# Patient Record
Sex: Male | Born: 1995 | Race: White | Hispanic: No | Marital: Single | Smoking: Current some day smoker
Health system: Southern US, Community
[De-identification: ages and names within clinical notes are randomized; demographics above are authoritative.]

## PROBLEM LIST (undated history)

## (undated) HISTORY — PX: FRACTURE SURGERY: SHX138

---
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
## 2019-01-31 ENCOUNTER — Emergency Department (HOSPITAL_COMMUNITY): Payer: Self-pay

## 2019-01-31 ENCOUNTER — Encounter (HOSPITAL_COMMUNITY): Payer: Self-pay | Admitting: Emergency Medicine

## 2019-01-31 ENCOUNTER — Emergency Department (HOSPITAL_COMMUNITY)
Admission: EM | Admit: 2019-01-31 | Discharge: 2019-01-31 | Disposition: A | Discharge status: Home / Self Care | Payer: Self-pay | Attending: Emergency Medicine | Admitting: Emergency Medicine

## 2019-01-31 ENCOUNTER — Other Ambulatory Visit: Payer: Self-pay

## 2019-01-31 DIAGNOSIS — S62367A Nondisplaced fracture of neck of fifth metacarpal bone, left hand, initial encounter for closed fracture: Secondary | ICD-10-CM | POA: Insufficient documentation

## 2019-01-31 DIAGNOSIS — Y9259 Other trade areas as the place of occurrence of the external cause: Secondary | ICD-10-CM | POA: Insufficient documentation

## 2019-01-31 DIAGNOSIS — W2209XA Striking against other stationary object, initial encounter: Secondary | ICD-10-CM | POA: Insufficient documentation

## 2019-01-31 DIAGNOSIS — Y9389 Activity, other specified: Secondary | ICD-10-CM | POA: Insufficient documentation

## 2019-01-31 DIAGNOSIS — F17210 Nicotine dependence, cigarettes, uncomplicated: Secondary | ICD-10-CM | POA: Insufficient documentation

## 2019-01-31 DIAGNOSIS — Y990 Civilian activity done for income or pay: Secondary | ICD-10-CM | POA: Insufficient documentation

## 2019-01-31 MED ORDER — MELOXICAM 7.5 MG PO TABS: 7.5000 mg | ORAL_TABLET | Freq: Two times a day (BID) | ORAL | 0 refills | Status: AC | PRN

## 2019-01-31 NOTE — Discharge Instructions (Signed)
Your testing shows either a new or old small fracture at the end of your fifth hand bone called the metacarpal.  Please wear the splint for the next week until you follow-up with the orthopedist.  I would recommend that you do not take this off but rather shower or bathe with a bag over your hand so that it does not get wet.  You may take ibuprofen or Aleve as needed for pain.  Ice and elevate to help minimize swelling.  Seek medical exam for severe or worsening symptoms including increasing pain numbness or weakness

## 2019-01-31 NOTE — ED Notes (Signed)
Rad in    Awaiting results

## 2019-01-31 NOTE — ED Triage Notes (Signed)
Punched a wall three days ago  Now with pain swelling and painful movement with fingers

## 2019-01-31 NOTE — ED Provider Notes (Signed)
[facility] EMERGENCY DEPARTMENT Provider Note   CSN: [id number] Arrival date & time: 01/31/19  1330    History   Chief Complaint Chief Complaint  Patient presents with  . Hand Problem    HPI Darrell Spencer is a 23 y.o. male.     HPI  This otherwise healthy 23 year old male states [place] [address] [place] [place] to punch a wall when he became upset while he was intoxicated.  He states that he had acute onset of pain but did not get it checked till he got home.  [place] is now [place] in [place] and wants this evaluated.  The pain is persistent, worse with movement of the hand and associated with swelling.  He has been taking ibuprofen with minimal relief.  No prior injury to this hand  History reviewed. No pertinent past medical history.  There are no active problems to display for this patient.   Past Surgical History:  Procedure Laterality Date  . FRACTURE SURGERY          Home Medications    Prior to Admission medications   Medication Sig Start Date End Date Taking? Authorizing Provider  meloxicam (MOBIC) 7.5 MG tablet Take 1 tablet (7.5 mg total) by mouth 2 (two) times daily as needed for up to 14 days for pain. 01/31/19 02/14/19  Eber HongMiller, Sire Poet, MD    Family History No family history on file.  Social History Social History   Tobacco Use  . Smoking status: Current Some Day Smoker    Packs/day: 1.00  . Smokeless tobacco: Never Used  Substance Use Topics  . Alcohol use: Yes  . Drug use: Yes    Types: Marijuana     Allergies   Patient has no known allergies.   Review of Systems Review of Systems  Musculoskeletal: Positive for arthralgias and myalgias.  Neurological: Negative for weakness and numbness.  Hematological: Does not bruise/bleed easily.     Physical Exam Updated Vital Signs BP (!) 143/64 (BP Location: Right Arm)   Pulse 66   Temp 98.6 F (37 C) (Oral)   Resp 16   Ht 1.702 m (5\' 7" )   Wt 63.5 kg   SpO2  99%   BMI 21.93 kg/m   Physical Exam Vitals signs and nursing note reviewed.  Constitutional:      Appearance: He is well-developed. He is not diaphoretic.  HENT:     Head: Normocephalic and atraumatic.  Eyes:     General:        Right eye: No discharge.        Left eye: No discharge.     Conjunctiva/sclera: Conjunctivae normal.  Pulmonary:     Effort: Pulmonary effort is normal. No respiratory distress.  Musculoskeletal:     Comments: Dec ROM of the fingers of the L hand - has pain with this - bruising of the fingers 5,4,3 (mild).  Normal ROM of the L wrist - other fingers / hands / legs without deformity  Skin:    General: Skin is warm and dry.     Findings: No erythema or rash.  Neurological:     Mental Status: He is alert.     Coordination: Coordination normal.     Comments: Normal sensation of the L hand      ED Treatments / Results  Labs (all labs ordered are listed, but only abnormal results are displayed) Labs Reviewed - No data to display  EKG  None  Radiology Dg Wrist Complete Left  Result Date: 01/31/2019 CLINICAL DATA:  Struck wall several days ago with persistent wrist pain, initial encounter EXAM: LEFT WRIST - COMPLETE 3+ VIEW COMPARISON:  None. FINDINGS: Changes consistent with a fifth metacarpal fracture are noted distally. Carpal bones appear within normal limits. No soft tissue abnormality is noted. IMPRESSION: Fifth metacarpal fracture. Normal appearing carpal bones. Electronically Signed   By: Inez Catalina M.D.   On: 01/31/2019 14:14   Dg Hand Complete Left  Result Date: 01/31/2019 CLINICAL DATA:  Left hand pain after injury several days ago. EXAM: LEFT HAND - COMPLETE 3+ VIEW COMPARISON:  Radiographs of January 29, 2013. FINDINGS: Deformity is seen involving distal fifth metacarpal consistent with probable old healed fracture. No acute fracture or dislocation is noted. Joint spaces are intact. No soft tissue abnormality is noted. IMPRESSION: Probable  old fracture seen involving distal fifth metacarpal. No definite acute abnormality seen. Electronically Signed   By: Marijo Conception M.D.   On: 01/31/2019 14:13    Procedures .Splint Application  Date/Time: 01/31/2019 2:46 PM Performed by: Noemi Chapel, MD Authorized by: Noemi Chapel, MD   Consent:    Consent obtained:  Verbal   Consent given by:  Patient   Risks discussed:  Discoloration, numbness, pain and swelling   Alternatives discussed:  No treatment Pre-procedure details:    Sensation:  Normal   Skin color:  Normal Procedure details:    Laterality:  Left   Location:  Hand   Hand:  L hand   Splint type:  Ulnar gutter   Supplies:  Ortho-Glass and cotton padding Post-procedure details:    Pain:  Improved   Sensation:  Normal   Skin color:  Normal   Patient tolerance of procedure:  Tolerated well, no immediate complications Comments:         (including critical care time)  Medications Ordered in ED Medications - No data to display   Initial Impression / Assessment and Plan / ED Course  I have reviewed the triage vital signs and the nursing notes.  Pertinent labs & imaging results that were available during my care of the patient were reviewed by me and considered in my medical decision making (see chart for details).  Clinical Course as of Jan 30 1446  Mon Jan 31, 2019  1442 I have personally looked at the x-ray, there is some evidence of either a previous or acute distal fifth metacarpal fracture.  Findings favor hold fracture.  Splinting placed by myself, please see attached notes   [BM]    Clinical Course User Index [BM] Noemi Chapel, MD       Injury to the hand, rule out fractures, has tenderness in the mid hand over the second through fifth metacarpals with mild deformity and swelling of the hand.  The patient is left-hand dominant, he has been taking ibuprofen with minimal relief imaging pending.  Final Clinical Impressions(s) / ED Diagnoses    Final diagnoses:  Closed nondisplaced fracture of neck of fifth metacarpal bone of left hand, initial encounter    ED Discharge Orders         Ordered    meloxicam (MOBIC) 7.5 MG tablet  2 times daily PRN     01/31/19 1447           Noemi Chapel, MD 01/31/19 1447

## 2019-02-01 ENCOUNTER — Telehealth: Payer: Self-pay | Admitting: Orthopedic Surgery

## 2019-02-01 NOTE — Telephone Encounter (Signed)
Patient returned call, and has been scheduled. Aware of appointment (Dr Luna Glasgow, Thursday, 02/03/19).

## 2019-02-01 NOTE — Telephone Encounter (Signed)
Call received via voice message from patient while closed for lunch inquiring about scheduling, following Emergency room visit for hand injury. Returned call to patient, [phone]; left message to return call

## 2019-02-03 ENCOUNTER — Other Ambulatory Visit: Payer: Self-pay

## 2019-02-03 ENCOUNTER — Ambulatory Visit: Payer: Self-pay | Admitting: Orthopaedic Surgery

## 2019-02-03 ENCOUNTER — Encounter: Payer: Self-pay | Admitting: Orthopaedic Surgery

## 2019-02-03 VITALS — BP 115/74 | HR 60 | Temp 98.3°F | Ht 67.0 in | Wt 151.0 lb

## 2019-02-03 DIAGNOSIS — F17210 Nicotine dependence, cigarettes, uncomplicated: Secondary | ICD-10-CM

## 2019-02-03 DIAGNOSIS — S62347A Nondisplaced fracture of base of fifth metacarpal bone. left hand, initial encounter for closed fracture: Secondary | ICD-10-CM

## 2019-02-03 NOTE — Patient Instructions (Signed)

## 2019-02-03 NOTE — Progress Notes (Signed)
Subjective:    Patient ID: Darrell Spencer, male    DOB: Dec 09, 1995, 23 y.o.   MRN: [id number]  HPI He hit a wall on 01-26-2019 and hurt his left hand.  He was seen in the ER 01-31-2019 and x-rays showed possible fracture of the right fifth metacarpal.  It was stated it could be old but he has never had an injury before.  He was given a gutter splint.  I have reviewed the ER notes and x-rays and report.   Review of Systems  Constitutional: Positive for activity change.  Musculoskeletal: Positive for arthralgias.  All other systems reviewed and are negative.  For Review of Systems, all other systems reviewed and are negative.  The following is a summary of the past history medically, past history surgically, known current medicines, social history and family history.  This information is gathered electronically by the computer from prior information and documentation.  I review this each visit and have found including this information at this point in the chart is beneficial and informative.   History reviewed. No pertinent past medical history.  Past Surgical History:  Procedure Laterality Date  . FRACTURE SURGERY      Current Outpatient Medications on File Prior to Visit  Medication Sig Dispense Refill  . meloxicam (MOBIC) 7.5 MG tablet Take 1 tablet (7.5 mg total) by mouth 2 (two) times daily as needed for up to 14 days for pain. (Patient not taking: Reported on 02/03/2019) 28 tablet 0   No current facility-administered medications on file prior to visit.     Social History   Socioeconomic History  . Marital status: Single    Spouse name: Not on file  . Number of children: Not on file  . Years of education: Not on file  . Highest education level: Not on file  Occupational History  . Not on file  Social Needs  . Financial resource strain: Not on file  . Food insecurity    Worry: Not on file    Inability: Not on file  . Transportation needs    Medical: Not on file   Non-medical: Not on file  Tobacco Use  . Smoking status: Current Some Day Smoker    Packs/day: 1.00  . Smokeless tobacco: Never Used  Substance and Sexual Activity  . Alcohol use: Yes  . Drug use: Yes    Types: Marijuana  . Sexual activity: Not on file  Lifestyle  . Physical activity    Days per week: Not on file    Minutes per session: Not on file  . Stress: Not on file  Relationships  . Social Herbalist on phone: Not on file    Gets together: Not on file    Attends religious service: Not on file    Active member of club or organization: Not on file    Attends meetings of clubs or organizations: Not on file    Relationship status: Not on file  . Intimate partner violence    Fear of current or ex partner: Not on file    Emotionally abused: Not on file    Physically abused: Not on file    Forced sexual activity: Not on file  Other Topics Concern  . Not on file  Social History Narrative  . Not on file    History reviewed. No pertinent family history.  BP 115/74   Pulse 60   Temp 98.3 F (36.8 C)   Ht 5\' 7"  (  1.702 m)   Wt 151 lb (68.5 kg)   BMI 23.65 kg/m   Body mass index is 23.65 kg/m.     Objective:   Physical Exam Vitals signs reviewed.  Constitutional:      Appearance: He is well-developed.  HENT:     Head: Normocephalic and atraumatic.  Eyes:     Conjunctiva/sclera: Conjunctivae normal.     Pupils: Pupils are equal, round, and reactive to light.  Neck:     Musculoskeletal: Normal range of motion and neck supple.  Cardiovascular:     Rate and Rhythm: Normal rate and regular rhythm.  Pulmonary:     Effort: Pulmonary effort is normal.  Abdominal:     Palpations: Abdomen is soft.  Musculoskeletal:     Left hand: He exhibits decreased range of motion.       Hands:  Skin:    General: Skin is warm and dry.  Neurological:     Mental Status: He is alert and oriented to person, place, and time.     Cranial Nerves: No cranial nerve  deficit.     Motor: No abnormal muscle tone.     Coordination: Coordination normal.     Deep Tendon Reflexes: Reflexes are normal and symmetric. Reflexes normal.  Psychiatric:        Behavior: Behavior normal.        Thought Content: Thought content normal.        Judgment: Judgment normal.           Assessment & Plan:   Encounter Diagnoses  Name Primary?  . Closed nondisplaced fracture of base of fifth metacarpal bone of left hand, initial encounter Yes  . Nicotine dependence, cigarettes, uncomplicated    I have applied [place] splint.  I have shown him how to do it.  Take Advil for pain.  He is working in [place] and will be heading back there this weekend.  His family is here.  I told him to get CD disc of x-rays and take with him.  Follow-up there.  Electronically Signed Darreld McleanWayne Aymen Widrig, MD 8/20/202010:37 AM

## 2020-03-20 IMAGING — CR LEFT HAND - COMPLETE 3+ VIEW
1 series · 3 of 3 positions shown · non-contrast
Comparison: Radiographs January 29, 2013.

CLINICAL DATA: Left hand pain after injury several days ago.

EXAM:
LEFT HAND - COMPLETE 3+ VIEW

[Series 1: pa · 0.17mm/px · 3 of 3 slices shown]
[im 1/3]
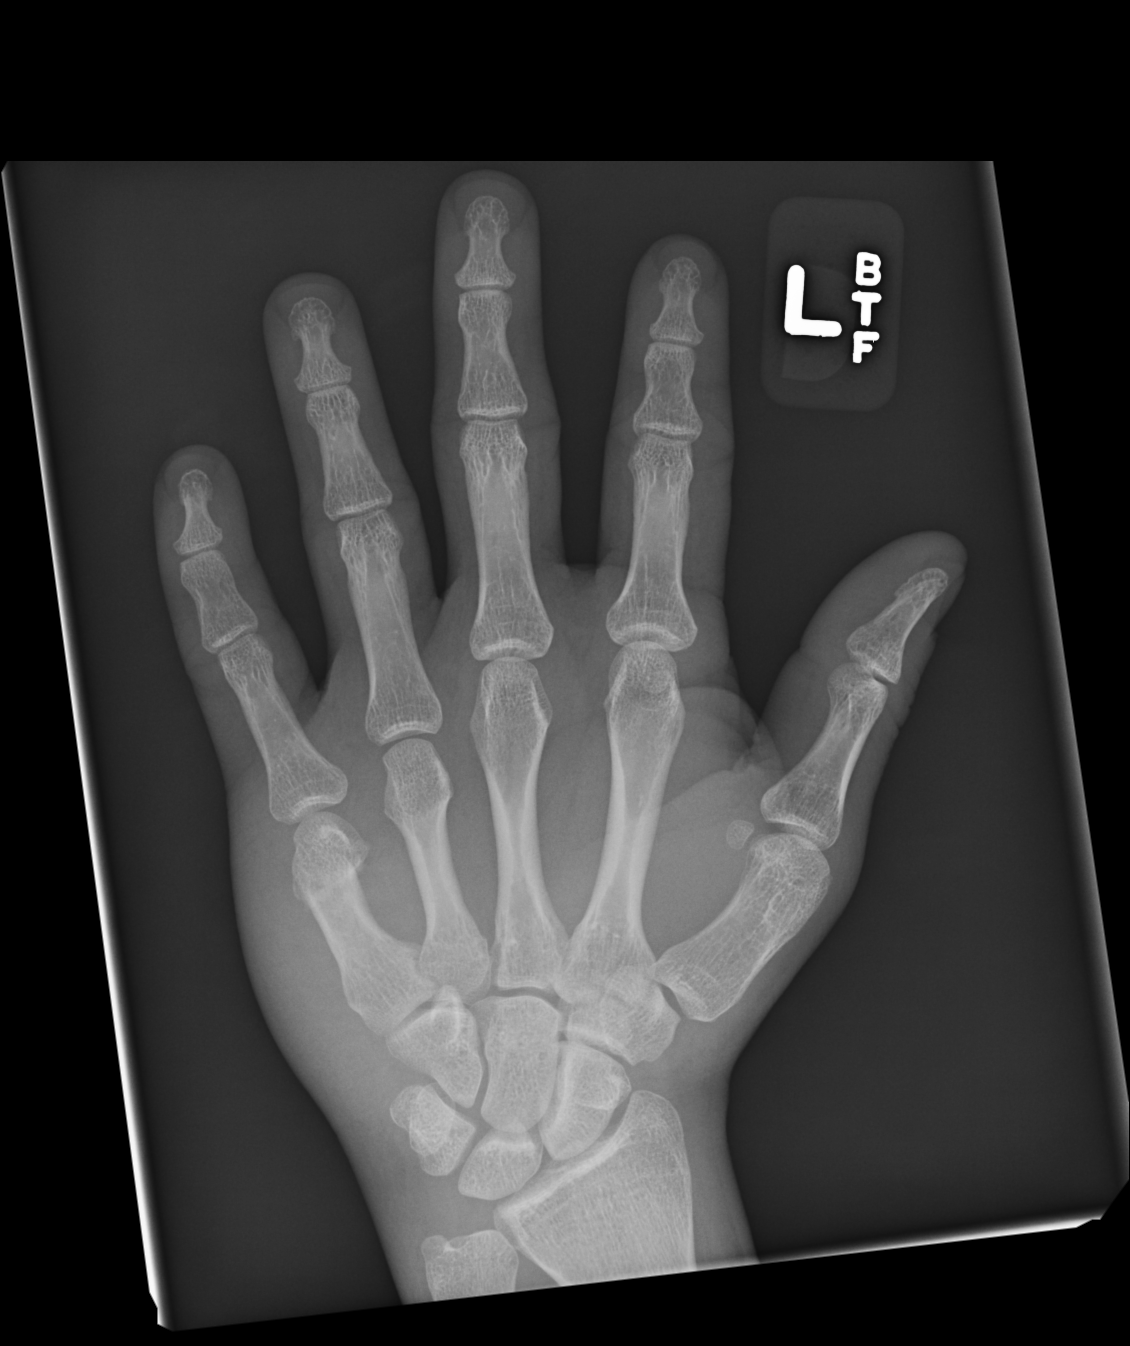
[im 2/3]
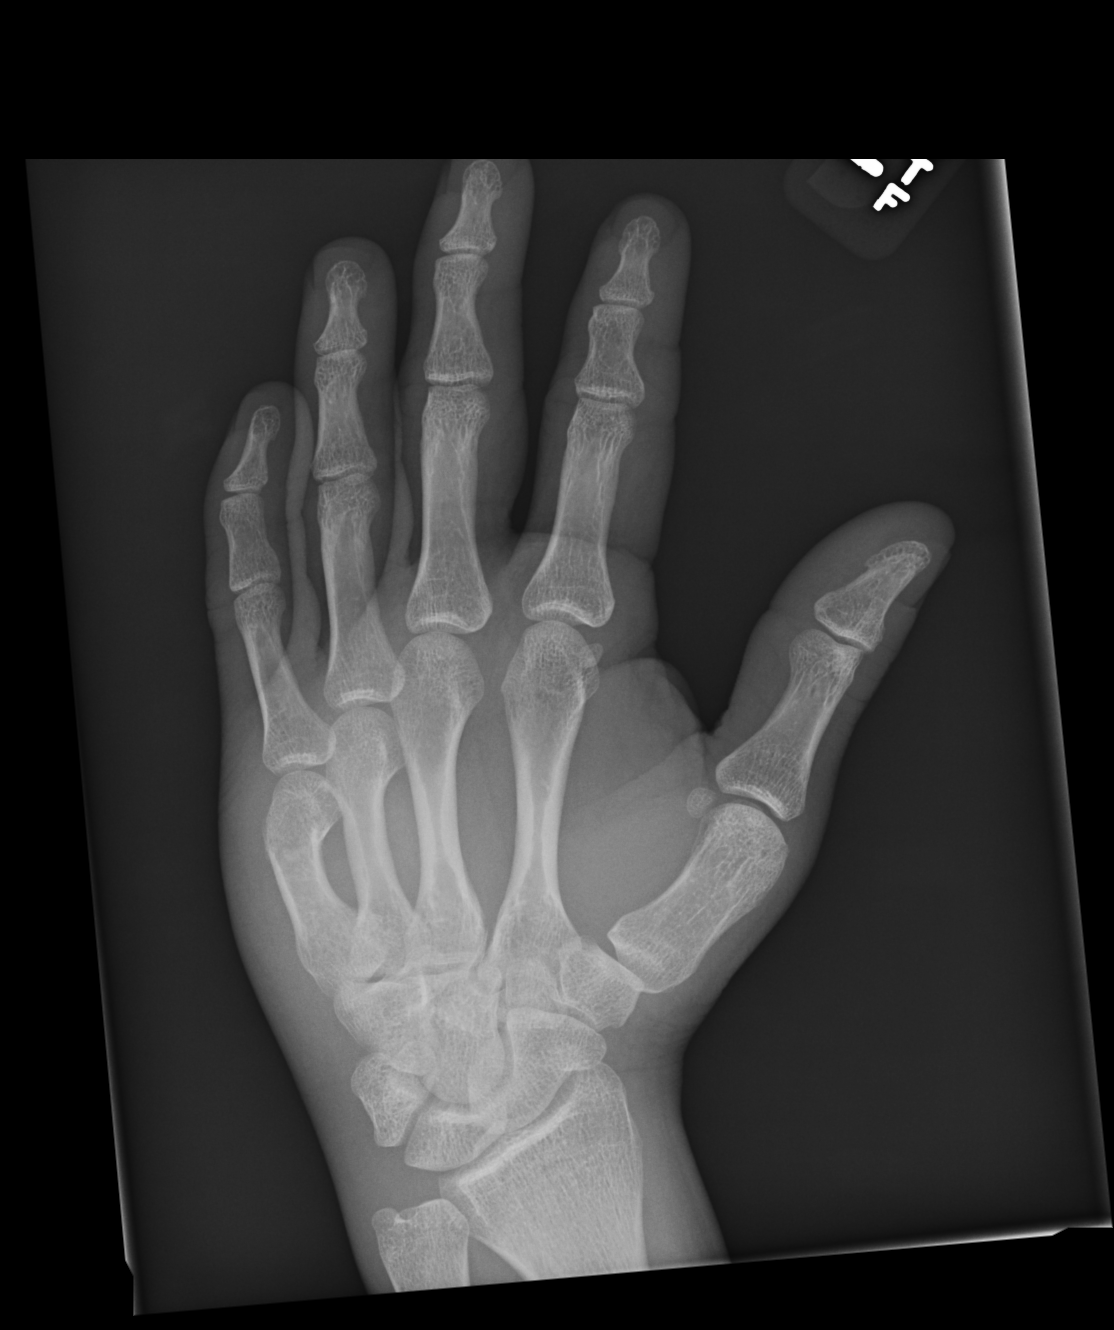
[im 3/3]
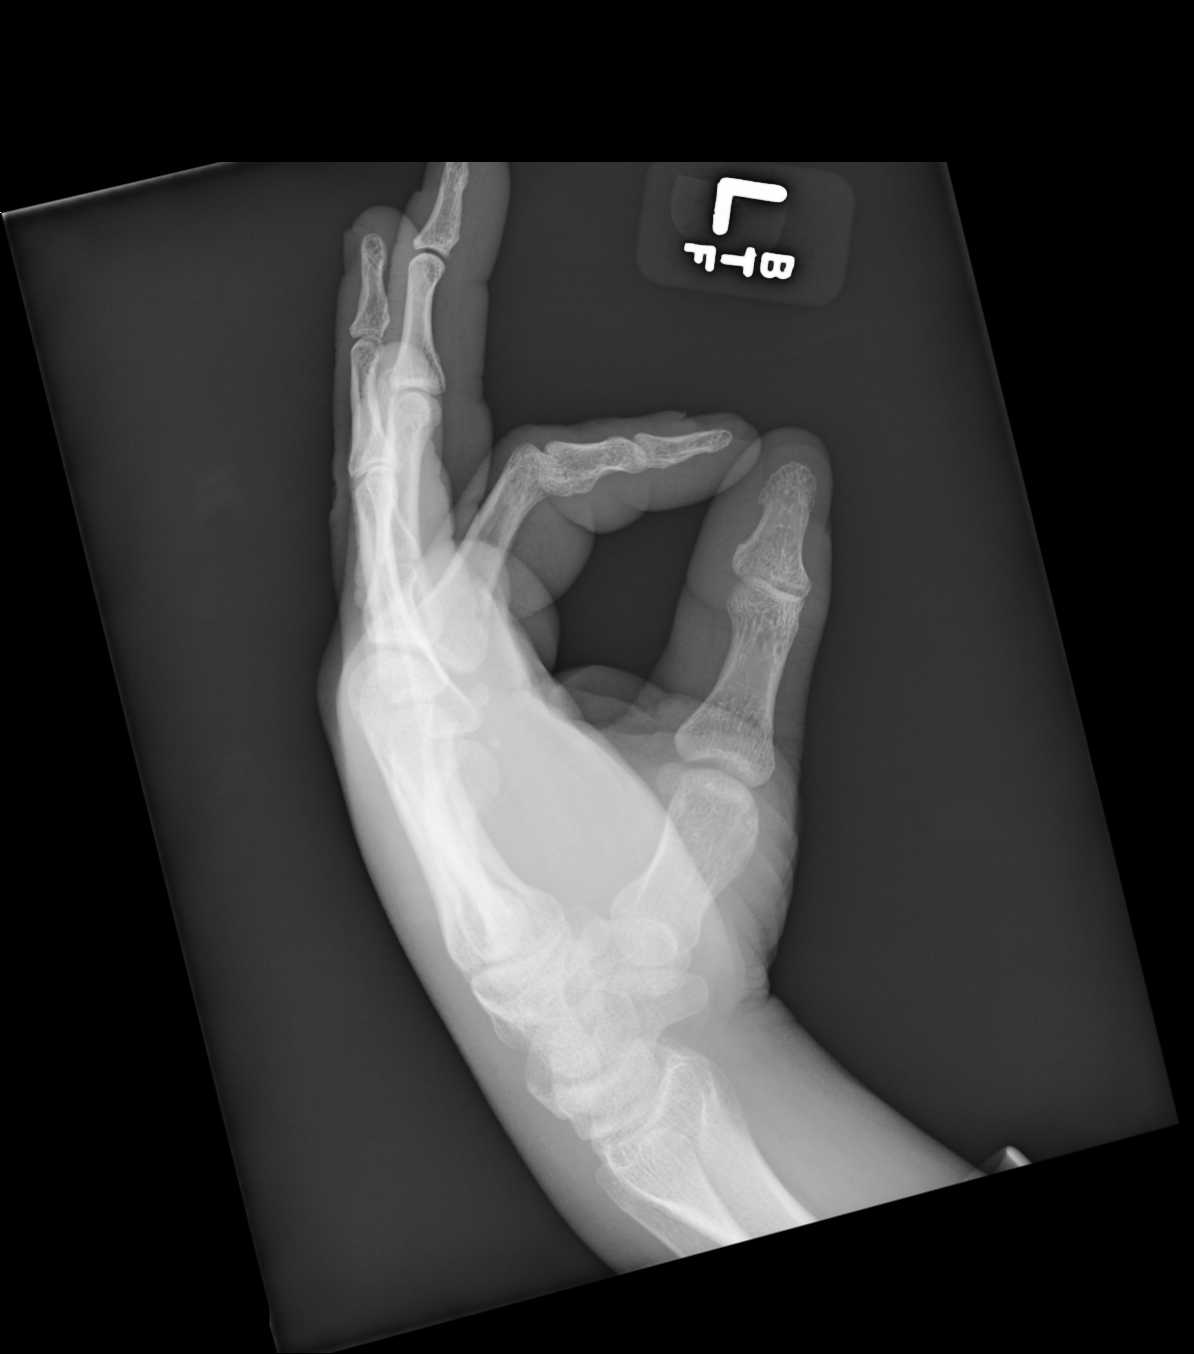

[3 of 3 positions shown; findings below may reference images not displayed]

FINDINGS: Deformity is seen involving distal fifth metacarpal consistent with
probable old healed fracture. No acute fracture or dislocation is
noted. Joint spaces are intact. No soft tissue abnormality is noted.
IMPRESSION: Probable old fracture seen involving distal fifth metacarpal. No
definite acute abnormality seen.
